# Patient Record
Sex: Male | Born: 1990 | Race: White | Hispanic: No | Marital: Single
Health system: Southern US, Community
[De-identification: ages and names within clinical notes are randomized; demographics above are authoritative.]

---
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
## 2018-06-11 ENCOUNTER — Ambulatory Visit: Payer: Self-pay | Admitting: Psychology

## 2020-10-04 ENCOUNTER — Emergency Department: Payer: Managed Care, Other (non HMO)

## 2020-10-04 ENCOUNTER — Emergency Department
Admission: EM | Admit: 2020-10-04 | Discharge: 2020-10-04 | Disposition: A | Discharge status: Home / Self Care | Payer: Managed Care, Other (non HMO) | Attending: Emergency Medicine | Admitting: Emergency Medicine

## 2020-10-04 ENCOUNTER — Other Ambulatory Visit: Payer: Self-pay

## 2020-10-04 DIAGNOSIS — R791 Abnormal coagulation profile: Secondary | ICD-10-CM | POA: Insufficient documentation

## 2020-10-04 DIAGNOSIS — F1290 Cannabis use, unspecified, uncomplicated: Secondary | ICD-10-CM | POA: Insufficient documentation

## 2020-10-04 DIAGNOSIS — R7309 Other abnormal glucose: Secondary | ICD-10-CM | POA: Insufficient documentation

## 2020-10-04 DIAGNOSIS — R4182 Altered mental status, unspecified: Secondary | ICD-10-CM | POA: Diagnosis present

## 2020-10-04 DIAGNOSIS — R4702 Dysphasia: Secondary | ICD-10-CM | POA: Diagnosis not present

## 2020-10-04 DIAGNOSIS — F410 Panic disorder [episodic paroxysmal anxiety] without agoraphobia: Secondary | ICD-10-CM | POA: Insufficient documentation

## 2020-10-04 LAB — DIFFERENTIAL
Abs Immature Granulocytes: 0.05 10*3/uL (ref 0.00–0.07)
Basophils Absolute: 0 10*3/uL (ref 0.0–0.1)
Basophils Relative: 0 %
Eosinophils Absolute: 0.1 10*3/uL (ref 0.0–0.5)
Eosinophils Relative: 1 %
Immature Granulocytes: 1 %
Lymphocytes Relative: 24 %
Lymphs Abs: 2.5 10*3/uL (ref 0.7–4.0)
Monocytes Absolute: 0.6 10*3/uL (ref 0.1–1.0)
Monocytes Relative: 6 %
Neutro Abs: 7.2 10*3/uL (ref 1.7–7.7)
Neutrophils Relative %: 68 %

## 2020-10-04 LAB — COMPREHENSIVE METABOLIC PANEL
ALT: 25 U/L (ref 0–44)
AST: 24 U/L (ref 15–41)
Albumin: 4.6 g/dL (ref 3.5–5.0)
Alkaline Phosphatase: 48 U/L (ref 38–126)
Anion gap: 8 (ref 5–15)
BUN: 14 mg/dL (ref 6–20)
CO2: 22 mmol/L (ref 22–32)
Calcium: 9.8 mg/dL (ref 8.9–10.3)
Chloride: 107 mmol/L (ref 98–111)
Creatinine, Ser: 1 mg/dL (ref 0.61–1.24)
GFR, Estimated: >60 mL/min (ref >60)
Glucose, Bld: 84 mg/dL (ref 70–99)
Potassium: 3.6 mmol/L (ref 3.5–5.1)
Sodium: 137 mmol/L (ref 135–145)
Total Bilirubin: 1.2 mg/dL (ref 0.3–1.2)
Total Protein: 7.4 g/dL (ref 6.5–8.1)

## 2020-10-04 LAB — CBC
HCT: 49.2 % (ref 39.0–52.0)
Hemoglobin: 17.7 g/dL — ABNORMAL HIGH (ref 13.0–17.0)
MCH: 32.4 pg (ref 26.0–34.0)
MCHC: 36 g/dL (ref 30.0–36.0)
MCV: 90.1 fL (ref 80.0–100.0)
Platelets: 201 10*3/uL (ref 150–400)
RBC: 5.46 MIL/uL (ref 4.22–5.81)
RDW: 12.4 % (ref 11.5–15.5)
WBC: 10.5 10*3/uL (ref 4.0–10.5)
nRBC: 0 % (ref 0.0–0.2)

## 2020-10-04 LAB — CBG MONITORING, ED: Glucose-Capillary: 100 mg/dL — ABNORMAL HIGH (ref 70–99)

## 2020-10-04 LAB — PROTIME-INR
INR: 1 (ref 0.8–1.2)
Prothrombin Time: 13.4 seconds (ref 11.4–15.2)

## 2020-10-04 LAB — URINE DRUG SCREEN, QUALITATIVE (ARMC ONLY)
Amphetamines, Ur Screen: NOT DETECTED
Barbiturates, Ur Screen: NOT DETECTED
Benzodiazepine, Ur Scrn: NOT DETECTED
Cannabinoid 50 Ng, Ur ~~LOC~~: POSITIVE — AB
Cocaine Metabolite,Ur ~~LOC~~: NOT DETECTED
MDMA (Ecstasy)Ur Screen: NOT DETECTED
Methadone Scn, Ur: NOT DETECTED
Opiate, Ur Screen: NOT DETECTED
Phencyclidine (PCP) Ur S: NOT DETECTED
Tricyclic, Ur Screen: NOT DETECTED

## 2020-10-04 LAB — SALICYLATE LEVEL: Salicylate Lvl: <7 mg/dL — ABNORMAL LOW (ref 7.0–30.0)

## 2020-10-04 LAB — ACETAMINOPHEN LEVEL: Acetaminophen (Tylenol), Serum: <10 ug/mL — ABNORMAL LOW (ref 10–30)

## 2020-10-04 LAB — APTT: aPTT: 33 seconds (ref 24–36)

## 2020-10-04 MED ORDER — SODIUM CHLORIDE 0.9% FLUSH
3.0000 mL | Freq: Once | INTRAVENOUS | Status: DC
Start: 2020-10-04 — End: 2020-10-04

## 2020-10-04 NOTE — Consult Note (Signed)
NEURO HOSPITALIST CONSULT NOTE   Requesting physician: Dr. Scotty Court  Reason for Consult: Acute onset of speech difficulty  History obtained from:  Patient, Coworker and Chart     HPI:                                                                                                                                          Walter Werner is an 30 y.o. male who presents from work with his supervisor with acute onset of difficulty speaking. The patient appeared highly anxious on presenting to the ED. He has a history of anxiety and depressive symptoms which were treated with fluvoxamine in high school. He is no longer on any medications. He stated to RNs that he initially thought he was having one of his anxiety attacks while at work today: "it felt like an anxiety attack, but then it wasn't". His supervisor at work reported that when Walter Werner  came up to him he could tell that something was not right with him. His speech was not normal, with a slow and stuttering quality. Initially the patient reported that it was hot; his supervisor asked if he had been drinking water and the patient stated that he had. His CBG was normal on arrival to the ED. On arrival to the ED, the patient was able to report that he was having weakness all over. He also stated to the Stroke Response Nurse that he felt numb on his left side.   PMHx Mood disorder Anxiety   FHx No family history on file.            SHx: Smokes 1 ppd x 2 years No chewing tobacco 1-2 beers on some days  All: No Known Allergies  MEDICATIONS:                                                                                                                     Not currently on any medications   ROS:  No trouble breathing, no chest pain, no GI symptoms.    Blood pressure (!) 151/80, pulse 67,  temperature 98.6 F (37 C), temperature source Oral, height 5\' 6"  (1.676 m), weight 90.7 kg, SpO2 100 %.   General Examination:                                                                                                       Physical Exam  HEENT-  [place]/AT   Lungs- Respirations unlabored Extremities- No edema   Neurological Examination Mental Status: The patient exhibits appears highly anxious with some motor agitation noted intermittently during the exam. Speech is with an atypical pattern: It is slow and stuttering, through clenched teeth and tensed lips, and appears effortful. At times the words are emitted in a stuttering fashion. Naming is intact for all fingers and he is oriented to the city, state, year and month. When asked the day, he states "Tu-Tu-Tuesday" to which his supervisor replies that he may have lost count as they are working a 7-day straight stint as [birth year]06-1982. The patient is able to follow two 3-step commands without  difficulty. His repetition is intact in the context of stuttering. Also in the context of stuttering, there are no errors of grammar or syntax with any of his laboriously uttered sentences. He is able to use humor with some of his replies to questions. No dyscalculia or right-left confusion. No neglect. He seems to have good insight. He is cooperative with the exam.  Cranial Nerves: II: Temporal visual fields intact with no extinction to DSS. PERRL.   III,IV, VI: No ptosis. EOMI.   V,VII: Grimaces symmetrically. Facial light temp sensation equal bilaterally VIII: Hearing intact to voice IX,X: No pharyngeal dysarthria XI: Hesitancy with shoulder shrug bilaterally, but movement is full XII: Midline tongue extension is also with hesitancy Motor: Symmetric strength in all 4 extremities proximally and distally with giveway in all muscle groups except for hand grip. Maximum strength elicitable in the context of giveway is 5/5.  No pronator drift.  Muscle  bulk is normal.  Sensory: Temp sensation decreased to LUE.  Deep Tendon Reflexes: 2+ and symmetric throughout Cerebellar: No ataxia with FNF bilaterally  Gait: Deferred   Lab Results: Basic Metabolic Panel: No results for input(s): NA, K, CL, CO2, GLUCOSE, BUN, CREATININE, CALCIUM, MG, PHOS in the last 168 hours.  CBC: No results for input(s): WBC, NEUTROABS, HGB, HCT, MCV, PLT in the last 168 hours.  Cardiac Enzymes: No results for input(s): CKTOTAL, CKMB, CKMBINDEX, TROPONINI in the last 168 hours.  Lipid Panel: No results for input(s): CHOL, TRIG, HDL, CHOLHDL, VLDL, LDLCALC in the last 168 hours.  Imaging: No results found.   Assessment: 30 year old male presenting with acute onset of anxiety with speech difficulty.  1. Exam reveals functional/nonphysiological speech pattern and giveway weakness x 4. No findings suggestive of a lesional aphasia are appreciated.  2. Also noted is significant anxiety. The patient states that he has had anxiety attacks in the past, but none were associated with a speech deficit.  3. Was  treated while in high school with fluvoxamine for depressive symptoms and anxiety. The patient states that he stopped this medication due to mood flattening.  4. The patient is a smoker. He states that he is a light drinker.   Recommendations: 1. Tox screen 2. MRI brain 3. Smoking cessation counseling 4. Psychiatry follow up to consider possible initiation of an anxiolytic medication versus stress-reduction techniques.     Electronically signed: Dr. Caryl Pina 10/04/2020, 11:57 AM

## 2020-10-04 NOTE — ED Notes (Signed)
Pt transported to MRI 

## 2020-10-04 NOTE — Discharge Instructions (Addendum)
Your lab tests, CT scan of the head, and MRI of the brain are all normal today.  Please follow-up with your doctor for further evaluation of your symptoms.

## 2020-10-04 NOTE — ED Provider Notes (Signed)
[facility] Department Provider Note  ____________________________________________  Time seen: Approximately 12:24 PM  I have reviewed the triage vital signs and the nursing notes.   HISTORY  Chief Complaint Weakness    Level 5 Caveat: Portions of the History and Physical including HPI and review of systems are unable to be completely obtained due to patient acute altered mental status.  HPI Walter Werner is a 30 y.o. male with no significant past medical history who comes ED complaining of inability to speak which started around 10 AM today.  Patient's supervisor at work last saw him behaving and speaking normally at 9:50 AM, and by 10:15 AM the patient was having his current symptoms.  Denies pain or headache or head trauma.  No fever.  He reports that his cognition and thought process feel normal.  No substance abuse.   Past medical history negative   There are no problems to display for this patient.       Prior to Admission medications   Not on File  None   Allergies Patient has no known allergies.   No family history on file.  Social History    Review of Systems Level 5 Caveat: Portions of the History and Physical including HPI and review of systems are unable to be completely obtained due to patient being a poor historian   Constitutional:   No known fever.  ENT:   No rhinorrhea. Cardiovascular:   No chest pain or syncope. Respiratory:   No dyspnea or cough. Gastrointestinal:   Negative for abdominal pain, vomiting and diarrhea.  Musculoskeletal:   Negative for focal pain or swelling ____________________________________________   PHYSICAL EXAM:  VITAL SIGNS: ED Triage Vitals  Enc Vitals Group     BP 10/04/20 1125 (!) 151/80     Pulse Rate 10/04/20 1125 67     Resp 10/04/20 1158 (!) 26     Temp 10/04/20 1125 98.6 F (37 C)     Temp Source 10/04/20 1125 Oral     SpO2 10/04/20 1125 100 %     Weight 10/04/20 1126  200 lb (90.7 kg)     Height 10/04/20 1126 5\' 6"  (1.676 m)     Head Circumference --      Peak Flow --      Pain Score --      Pain Loc --      Pain Edu? --      Excl. in GC? --     Vital signs reviewed, nursing assessments reviewed.   Constitutional:   Alert and oriented. Non-toxic appearance.  Appears emotionally upset Eyes:   Conjunctivae are normal. EOMI. PERRL.  No nystagmus ENT      Head:   Normocephalic and atraumatic.      Nose:   No congestion/rhinnorhea.       Mouth/Throat:   MMM, no pharyngeal erythema. No peritonsillar mass.       Neck:   No meningismus. Full ROM. Hematological/Lymphatic/Immunilogical:   No cervical lymphadenopathy. Cardiovascular:   RRR. Symmetric bilateral radial and DP pulses.  No murmurs. Cap refill less than 2 seconds. Respiratory:   Normal respiratory effort without tachypnea/retractions. Breath sounds are clear and equal bilaterally. No wheezes/rales/rhonchi. Gastrointestinal:   Soft and nontender. Non distended. There is no CVA tenderness.  No rebound, rigidity, or guarding. Genitourinary:   deferred Musculoskeletal:   Normal range of motion in all extremities. No joint effusions.  No lower extremity tenderness.  No edema. Neurologic:   Minimal  speech Cranial nerves unable to be fairly tested due to patient exhibiting quivering and tensing muscle movement of the bilateral face symmetrically with maneuvers. Motor grossly intact. Stroke scale 1 Skin:    Skin is warm, dry and intact. No rash noted.  No petechiae, purpura, or bullae.  ____________________________________________    LABS (pertinent positives/negatives) (all labs ordered are listed, but only abnormal results are displayed) Labs Reviewed  CBC - Abnormal; Notable for the following components:      Result Value   Hemoglobin 17.7 (*)    All other components within normal limits  CBG MONITORING, ED - Abnormal; Notable for the following components:   Glucose-Capillary 100 (*)     All other components within normal limits  PROTIME-INR  APTT  DIFFERENTIAL  COMPREHENSIVE METABOLIC PANEL  SALICYLATE LEVEL  ACETAMINOPHEN LEVEL  URINE DRUG SCREEN, QUALITATIVE ([facility] ONLY)   ____________________________________________   EKG  Interpreted by me Normal sinus rhythm rate of 75, right axis, normal intervals.  Poor R wave progression.  Normal ST segments and T waves, no evidence of toxidrome  ____________________________________________    RADIOLOGY  CT HEAD CODE STROKE WO CONTRAST  Result Date: 10/04/2020 CLINICAL DATA:  Code stroke.  Neuro deficit, acute, stroke suspected EXAM: CT HEAD WITHOUT CONTRAST TECHNIQUE: Contiguous axial images were obtained from the base of the skull through the vertex without intravenous contrast. COMPARISON:  None. FINDINGS: Brain: No evidence of acute large vascular territory infarction, hemorrhage, hydrocephalus, extra-axial collection or mass lesion/mass effect. Vascular: No hyperdense vessel identified. Skull: Normal. Negative for fracture or focal lesion. Sinuses/Orbits: Clear visualized sinuses. No acute orbital findings. Other: No mastoid effusions. ASPECTS [facility] Stroke Program Early CT Score) total score (0-10 with 10 being normal): 10. IMPRESSION: No evidence of acute large vascular territory infarct or acute hemorrhage. ASPECTS is 10. Code stroke imaging results were communicated on 10/04/2020 at 12:00 pm to provider Walter Werner via telephone, who verbally acknowledged these results. Electronically Signed   By: Walter Harts MD   On: 10/04/2020 12:03    ____________________________________________   PROCEDURES Procedures  ____________________________________________  DIFFERENTIAL DIAGNOSIS   Anxiety attack, stroke, substance abuse, intracranial hemorrhage  CLINICAL IMPRESSION / ASSESSMENT AND PLAN / ED COURSE  Medications ordered in the ED: Medications  sodium chloride flush (NS) 0.9 % injection 3 mL ( Intravenous  Canceled Entry 10/04/20 1201)    Pertinent labs & imaging results that were available during my care of the patient were reviewed by me and considered in my medical decision making (see chart for details).   Walter Werner was evaluated in Emergency Department on 10/04/2020 for the symptoms described in the history of present illness. He was evaluated in the context of the global COVID-19 pandemic, which necessitated consideration that the patient might be at risk for infection with the SARS-CoV-2 virus that causes COVID-19. Institutional protocols and algorithms that pertain to the evaluation of patients at risk for COVID-19 are in a state of rapid change based on information released by regulatory bodies including the CDC and federal and state organizations. These policies and algorithms were followed during the patient's care in the ED.     Clinical Course as of 10/04/20 1455  Mon Oct 04, 2020  1151 Patient presents with altered mental status, aphasia which is sudden onset since 9:50 AM today.  No focal motor deficits.  Code stroke initiated. [PS]  1219 D/w CT who notes normal appearing CT head. D/w neuro who advises the syndrome is unlikely to be stroke  and suspicious for anxiety attack. Will obtain MRI for further risk stratification after which pt can be DC and will f/u with his psychiatrist.  [PS]  1455 MRI normal.  Symptoms improved.  Stable for discharge. [PS]    Clinical Course User Index [PS] Sharman Cheek, MD     ____________________________________________   FINAL CLINICAL IMPRESSION(S) / ED DIAGNOSES    Final diagnoses:  Anxiety attack     ED Discharge Orders     None       Portions of this note were generated with dragon dictation software. Dictation errors may occur despite best attempts at proofreading.   Sharman Cheek, MD 10/04/20 1229

## 2020-10-04 NOTE — Progress Notes (Signed)
CODE STROKE- PHARMACY COMMUNICATION   Time CODE STROKE called/page received:11:49  Time response to CODE STROKE was made (in person): 11:56  Time Stroke Kit retrieved from [place] (only if needed): not needed  Name of Provider/Nurse contacted:Olivia RN, Cheral Marker MD  No past medical history on file. Prior to Admission medications   Not on [place] ,PharmD Clinical Pharmacist  10/04/2020  12:36 PM

## 2020-10-04 NOTE — ED Triage Notes (Signed)
Pt brought in by supervisor from work, he reports that every time he came up to him he could tell that something was not right with the pt. He reports that it was hot and ask if he had been drinking water and the pt states that he had. Pt reports that he is having weakness all over. He has slow speech that is not normal for him

## 2023-10-07 ENCOUNTER — Emergency Department (HOSPITAL_COMMUNITY): Payer: Worker's Compensation

## 2023-10-07 ENCOUNTER — Encounter (HOSPITAL_COMMUNITY): Payer: Self-pay

## 2023-10-07 ENCOUNTER — Emergency Department (HOSPITAL_COMMUNITY)
Admission: EM | Admit: 2023-10-07 | Discharge: 2023-10-07 | Disposition: A | Discharge status: Home / Self Care | Payer: Worker's Compensation | Attending: Emergency Medicine | Admitting: Emergency Medicine

## 2023-10-07 ENCOUNTER — Other Ambulatory Visit: Payer: Self-pay

## 2023-10-07 DIAGNOSIS — M545 Low back pain, unspecified: Secondary | ICD-10-CM

## 2023-10-07 DIAGNOSIS — W19XXXA Unspecified fall, initial encounter: Secondary | ICD-10-CM | POA: Diagnosis not present

## 2023-10-07 DIAGNOSIS — M533 Sacrococcygeal disorders, not elsewhere classified: Secondary | ICD-10-CM | POA: Insufficient documentation

## 2023-10-07 DIAGNOSIS — Y990 Civilian activity done for income or pay: Secondary | ICD-10-CM | POA: Diagnosis not present

## 2023-10-07 MED ORDER — METHOCARBAMOL 500 MG PO TABS: 500.0000 mg | ORAL_TABLET | Freq: Two times a day (BID) | ORAL | 0 refills | Status: AC

## 2023-10-07 NOTE — ED Triage Notes (Signed)
 Pt states he was leaning against a table at work and the table broke and he fell through it landing on his bottom, pt c/o bottom pain. Pt states he hit his head but denies any head pain, he did have a safety helmet on.

## 2023-10-07 NOTE — ED Provider Notes (Signed)
 [place] EMERGENCY DEPARTMENT AT [facility] Provider Note   CSN: [id number] Arrival date & time: 10/07/23  1106     Patient presents with: Walter Werner is a 33 y.o. male who presents to the emergency department with a chief complaint of fall.  Patient states that he was at work and was leaning backwards onto a table whenever the table support broke and he fell directly straight down onto his rear end.  He states then that the table flipped up and hit him on the back of the head.  He however states that he was wearing a hard hat at this time and denies head injury.  Denies swelling to his head, laceration, visual disturbances, nausea, vomiting, headache, dizziness.  Denies neck pain.  Patient states that he has been ambulatory since the injury and has some low back pain as well as buttock pain.  Patient denies significant past medical history and takes no prescription medications at home.  Denies numbness/tingling to lower extremities, denies groin numbness.  Denies urinary or bowel symptoms.    Fall       Prior to Admission medications   Medication Sig Start Date End Date Taking? Authorizing Provider  methocarbamol  (ROBAXIN ) 500 MG tablet Take 1 tablet (500 mg total) by mouth 2 (two) times daily. 10/07/23  Yes Sintia Mckissic F, PA-C    Allergies: Patient has no known allergies.    Review of Systems  Musculoskeletal:  Positive for back pain.    Updated Vital Signs BP (!) 178/101 (BP Location: Right Arm)   Pulse (!) 102   Temp 98.1 F (36.7 C) (Oral)   Resp 18   SpO2 100%   Physical Exam Vitals and nursing note reviewed.  Constitutional:      General: He is awake. He is not in acute distress.    Appearance: Normal appearance. He is not ill-appearing, toxic-appearing or diaphoretic.  HENT:     Head: Normocephalic and atraumatic.     Comments: No racoon eyes, no battles sign, scalp and facial bones non tender to palpation, no obvious swelling, bruising,  or lacerations  Eyes:     General: No scleral icterus. Pulmonary:     Effort: Pulmonary effort is normal. No respiratory distress.  Musculoskeletal:        General: Normal range of motion.     Cervical back: Normal range of motion. No rigidity or tenderness.     Right lower leg: No edema.     Left lower leg: No edema.     Comments: Tenderness with palpation of lumbar spine as well as paraspinal muscles, tenderness just above the gluteal cleft as well as bilateral gluteal muscles  Skin:    General: Skin is warm.     Capillary Refill: Capillary refill takes less than 2 seconds.  Neurological:     General: No focal deficit present.     Mental Status: He is alert and oriented to person, place, and time.     Sensory: No sensory deficit.     Motor: No weakness.     Coordination: Coordination normal.     Gait: Gait normal.  Psychiatric:        Mood and Affect: Mood normal.        Behavior: Behavior normal. Behavior is cooperative.     (all labs ordered are listed, but only abnormal results are displayed) Labs Reviewed - No data to display  EKG: None  Radiology: DG Sacrum/Coccyx Result Date: 10/07/2023 EXAM:  2 VIEW(S) XRAY OF THE SACRUM AND COCCYX 10/07/2023 12:22:10 PM COMPARISON: None available. CLINICAL HISTORY: Fall, lumbar spine tenderness. Pt states he was leaning against a table at work and the table broke and he fell through it landing on his bottom, pt c/o bottom pain. FINDINGS: BONES AND JOINTS: No acute fracture. No focal osseous lesion. No joint dislocation. SOFT TISSUES: The soft tissues are unremarkable. IMPRESSION: 1. No significant abnormality. Electronically signed by: Lonni Necessary MD 10/07/2023 12:38 PM EDT RP Workstation: [id number]   DG Pelvis 1-2 Views Result Date: 10/07/2023 EXAM: 1 or 2 VIEW(S) XRAY OF THE PELVIS 10/07/2023 12:22:10 PM COMPARISON: None available. CLINICAL HISTORY: Fall, lumbar spine tenderness. Pt states he was leaning against a table at  work and the table broke and he fell through it landing on his bottom, pt c/o bottom pain. FINDINGS: BONES AND JOINTS: No acute fracture. No focal osseous lesion. No joint dislocation. SOFT TISSUES: The soft tissues are unremarkable. IMPRESSION: 1. No significant abnormality. Electronically signed by: Lonni Necessary MD 10/07/2023 12:37 PM EDT RP Workstation: [id number]   DG Lumbar Spine Complete Result Date: 10/07/2023 EXAM: 4 VIEW(S) XRAY OF THE LUMBAR SPINE 10/07/2023 12:22:10 PM COMPARISON: None available. CLINICAL HISTORY: Fall, lumbar spine tenderness. Pt states he was leaning against a table at work and the table broke and he fell through it landing on his bottom, pt c/o bottom pain. FINDINGS: LUMBAR SPINE: BONES: No acute fracture. No aggressive appearing osseous lesion. Alignment is normal. DISCS AND DEGENERATIVE CHANGES: Minimal loss of intervertebral disc height at the lumbosacral junction. SOFT TISSUES: No acute abnormality. IMPRESSION: 1. No acute abnormality of the lumbar spine related to the reported fall and lumbar spine tenderness. 2. Minimal loss of intervertebral disc height at the lumbosacral junction. Electronically signed by: Rockey Kilts MD 10/07/2023 12:34 PM EDT RP Workstation: [id number]     Procedures   Medications Ordered in the ED - No data to display                                  Medical Decision Making Amount and/or Complexity of Data Reviewed Radiology: ordered.  Risk Prescription drug management.   Patient presents to the ED for concern of fall, lumbar spine and buttock pain, this involves an extensive number of treatment options, and is a complaint that carries with it a high risk of complications and morbidity.  The differential diagnosis includes bar spine fracture, sacral fracture, coccyx fracture, nerve impingement, muscle strain, soft tissue injury, cauda equina syndrome, etc.   Co morbidities that complicate the patient  evaluation  none   Imaging Studies ordered:  I ordered imaging studies including x-ray of sacrum/coccyx, x-ray pelvis, x-ray of lumbar spine I independently visualized and interpreted imaging which showed no acute fractures or dislocations, possible minimal loss of lumbar height at lumbosacral junction I agree with the radiologist interpretation   Cardiac Monitoring:  The patient was maintained on a cardiac monitor.  I personally viewed and interpreted the cardiac monitored which showed an underlying rhythm of: none   Medicines ordered and prescription drug management: I have reviewed the patients home medicines and have made adjustments as needed   Test Considered:  Head CT: declined at this time as patient denies current head symptoms including headache, head pain, visual disturbances, nausea, vomiting, neck pain, swelling, laceration, patient grossly neurologically intact on my exam   Critical Interventions:  none    Problem List /  ED Course:  33 year old otherwise healthy male, mechanical fall at work, fell directly on buttock region now experiencing lumbar spine pain as well as buttock pain Vital signs stable On physical exam patient ambulatory, denies numbness/tingling, denies urinary/bowel incontinence, denies groin numbness, paraspinal lumbar tenderness as well as lumbar spine tenderness, tenderness worse over buttock region worse on left side X-rays ordered including x-ray of sacrum/coccyx, x-ray pelvis, x-ray of lumbar spine all negative for acute abnormality Patient updated on results  Instructed for OTC supportive care, muscle relaxant prescribed, offered orthopedic referral but declined at this time Return precautions given Patient discharged Most likely diagnosis at this time is acute muscle strain versus soft tissue injury due to fall, no fractures seen today on imaging, patient vital signs stable, patient ambulatory under own power not in significant  discomfort, denies numbness/tingling radiation, denies groin numbness or tingling, denies urinary/bowel symptoms, low suspicion for cauda equina syndrome or significant nerve impingement   Reevaluation:  After the interventions noted above, I reevaluated the patient and found that they have :stayed the same   Social Determinants of Health:  Worker's comp injury   Dispostion:  After consideration of the diagnostic results and the patients response to treatment, I feel that the patient would benefit from discharge and outpatient therapy as prescribed.     Final diagnoses:  Fall, initial encounter  Acute midline low back pain without sciatica  Sacral pain    ED Discharge Orders          Ordered    methocarbamol  (ROBAXIN ) 500 MG tablet  2 times daily        10/07/23 [address] PA-C 10/07/23 1927    Doretha Folks, MD 10/10/23 [phone]

## 2023-10-07 NOTE — Discharge Instructions (Addendum)
 It was a pleasure to take care of you today.  Today the x-rays taken of your low back, pelvis, sacrum and coccyx were all negative for acute abnormality or fracture. I recommend supportive care with over the counter tylenol  and ibuprofen to help with pain and discomfort. These medications can be alternated every 3-4 hours to help with pain and discomfort, please keep in mind that the daily max of over the counter tylenol  is 4000mg /day and for ibuprofen it is 1200mg /day. You have also been prescribed a muscle relaxant medication, please take as prescribed and do not drive or operate heavy machinery after taking the medication as it can make you drowsy.  If you are experiencing the following symptoms including but not limited to nausea, vomiting, headache, severe head pain, severe neck pain, radiating numbness/tingling, groin numbness, issues urinating or with bowel movements, issues walking, or other concerning symptom please return to the emergency department or seek further medical care.
